# Patient Record
Sex: Female | Born: 1955 | Race: White | Hispanic: No | Smoking: Never smoker
Health system: Southern US, Community
[De-identification: ages and names within clinical notes are randomized; demographics above are authoritative.]

## PROBLEM LIST (undated history)

## (undated) DIAGNOSIS — I4891 Unspecified atrial fibrillation: Secondary | ICD-10-CM

## (undated) HISTORY — PX: CORONARY ANGIOPLASTY WITH STENT PLACEMENT: SHX49

---

## 2020-03-06 ENCOUNTER — Emergency Department (HOSPITAL_BASED_OUTPATIENT_CLINIC_OR_DEPARTMENT_OTHER): Payer: BC Managed Care – PPO

## 2020-03-06 ENCOUNTER — Encounter (HOSPITAL_BASED_OUTPATIENT_CLINIC_OR_DEPARTMENT_OTHER): Payer: Self-pay | Admitting: *Deleted

## 2020-03-06 ENCOUNTER — Other Ambulatory Visit: Payer: Self-pay

## 2020-03-06 DIAGNOSIS — N23 Unspecified renal colic: Secondary | ICD-10-CM | POA: Insufficient documentation

## 2020-03-06 DIAGNOSIS — I4891 Unspecified atrial fibrillation: Secondary | ICD-10-CM | POA: Diagnosis not present

## 2020-03-06 DIAGNOSIS — Z7901 Long term (current) use of anticoagulants: Secondary | ICD-10-CM | POA: Diagnosis not present

## 2020-03-06 DIAGNOSIS — R109 Unspecified abdominal pain: Secondary | ICD-10-CM | POA: Diagnosis present

## 2020-03-06 LAB — URINALYSIS, ROUTINE W REFLEX MICROSCOPIC
Bilirubin Urine: NEGATIVE
Glucose, UA: NEGATIVE mg/dL
Ketones, ur: NEGATIVE mg/dL
Leukocytes,Ua: NEGATIVE
Nitrite: NEGATIVE
Protein, ur: NEGATIVE mg/dL
Specific Gravity, Urine: 1.025 (ref 1.005–1.030)
pH: 6 (ref 5.0–8.0)

## 2020-03-06 LAB — URINALYSIS, MICROSCOPIC (REFLEX): WBC, UA: NONE SEEN WBC/hpf (ref 0–5)

## 2020-03-06 MED ORDER — ONDANSETRON 4 MG PO TBDP: 4.0000 mg | ORAL_TABLET | Freq: Once | ORAL | Status: AC

## 2020-03-06 MED ORDER — OXYCODONE-ACETAMINOPHEN 5-325 MG PO TABS
ORAL_TABLET | ORAL | Status: AC
  Administered 2020-03-06: 1 via ORAL
  Filled 2020-03-06: qty 1

## 2020-03-06 MED ORDER — ONDANSETRON 4 MG PO TBDP
ORAL_TABLET | ORAL | Status: AC
  Administered 2020-03-06: 4 mg via ORAL
  Filled 2020-03-06: qty 1

## 2020-03-06 MED ORDER — OXYCODONE-ACETAMINOPHEN 5-325 MG PO TABS: 1.0000 | ORAL_TABLET | Freq: Once | ORAL | Status: AC

## 2020-03-06 NOTE — ED Triage Notes (Signed)
C/o sudden onset of right flank pain with hematuria x 15 mins ago

## 2020-03-06 NOTE — ED Notes (Signed)
Pt states flank pain stopped after urination with u/a collection with  sm stone like object noted in urine cup

## 2020-03-07 ENCOUNTER — Emergency Department (HOSPITAL_BASED_OUTPATIENT_CLINIC_OR_DEPARTMENT_OTHER)
Admission: EM | Admit: 2020-03-07 | Discharge: 2020-03-07 | Disposition: A | Discharge status: Home / Self Care | Payer: BC Managed Care – PPO | Attending: Emergency Medicine | Admitting: Emergency Medicine

## 2020-03-07 DIAGNOSIS — N23 Unspecified renal colic: Secondary | ICD-10-CM

## 2020-03-07 HISTORY — DX: Unspecified atrial fibrillation: I48.91

## 2020-03-07 NOTE — ED Notes (Signed)
Verified pharmacy - patient does want her medications sent to [place] [facility]

## 2020-03-07 NOTE — ED Provider Notes (Signed)
[facility] Provider Note: Rhonda Dell, MD, FACEP  CSN: [id number] MRN: [id number] ARRIVAL: 03/06/20 at 2157 ROOM: [facility]03/[facility]03   CHIEF COMPLAINT  Flank Pain   HISTORY OF PRESENT ILLNESS  03/07/20 2:08 AM Rhonda Craig is a 64 y.o. female who noticed blood in your urine yesterday morning but this resolved later in the day.  About 9:45 PM yesterday she developed severe right flank pain which she rated as a 9 out of 10 on arrival.  It was not affected by movement. She had associated nausea.  She was given oxycodone and Zofran in triage.  The pain subsequently resolved and she is asymptomatic now.   Past Medical History:  Diagnosis Date  . Atrial fibrillation [facility])     Past Surgical History:  Procedure Laterality Date  . CORONARY ANGIOPLASTY WITH STENT PLACEMENT      History reviewed. No pertinent family history.  Social History   Tobacco Use  . Smoking status: Never Smoker  . Smokeless tobacco: Never Used  Substance Use Topics  . Alcohol use: Not on file  . Drug use: Not on file    Prior to Admission medications   Medication Sig Start Date End Date Taking? Authorizing Provider  amoxicillin-clavulanate (AUGMENTIN) 875-125 MG tablet SMARTSIG:1 Tablet(s) By Mouth Every 12 Hours 01/26/20   [provider]  calcium-vitamin D (OSCAL WITH D) 500-200 MG-UNIT TABS tablet Take by mouth.    [provider]  cetirizine (ZYRTEC) 10 MG tablet Take by mouth.    [provider]  ciprofloxacin-dexamethasone (CIPRODEX) OTIC suspension INSTILL 4 DROPS INTO AFFECTED EAR(S) TWICE A DAY FOR 7 DAYS 02/11/20   [provider]  clindamycin (CLEOCIN) 300 MG capsule Take 300 mg by mouth 3 (three) times daily. 02/07/20   [provider]  clopidogrel (PLAVIX) 300 MG TABS tablet Take by mouth.    [provider]  diltiazem (CARDIZEM) 60 MG tablet Take by mouth.    [provider]  dofetilide (TIKOSYN) 500 MCG capsule Take 500 mcg  by mouth 2 (two) times daily. 02/22/20   [provider]  ELIQUIS 5 MG TABS tablet Take 5 mg by mouth 2 (two) times daily. 02/22/20   [provider]  fluticasone (FLONASE) 50 MCG/ACT nasal spray Place 1 spray into both nostrils daily. 02/17/20   [provider]  HYDROcodone-acetaminophen (NORCO/VICODIN) 5-325 MG tablet Take 1 tablet by mouth every 6 (six) hours as needed. 01/26/20   [provider]  ipratropium (ATROVENT) 0.06 % nasal spray Place into both nostrils. 02/17/20   [provider]  levothyroxine (SYNTHROID) 88 MCG tablet Take 88 mcg by mouth daily. 02/17/20   [provider]  NASCOBAL 500 MCG/0.1ML SOLN Place into both nostrils once a week. 03/06/20   [provider]  vitamin B-12 (CYANOCOBALAMIN) 500 MCG tablet Take by mouth.    [provider]    Allergies Vibramycin [doxycycline]   REVIEW OF SYSTEMS  Negative except as noted here or in the History of Present Illness.   PHYSICAL EXAMINATION  Initial Vital Signs Blood pressure 138/79, pulse (!) 56, temperature 98.5 F (36.9 C), temperature source Oral, resp. rate 14, height 5\' 5"  (1.651 m), weight 78.5 kg, SpO2 100 %.  Examination General: Well-developed, well-nourished female in no acute distress; appearance consistent with age of record HENT: normocephalic; atraumatic Eyes: Normal appearance Neck: supple Heart: regular rate and rhythm Lungs: clear to auscultation bilaterally Abdomen: soft; nondistended; nontender; bowel sounds present GU: No CVA tenderness Extremities: No  deformity; full range of motion; pulses normal Neurologic: Awake, alert and oriented; motor function intact in all extremities and symmetric; no facial droop Skin: Warm and dry Psychiatric: Normal mood and affect   RESULTS  Summary of this visit's results, reviewed and interpreted by myself:   EKG Interpretation  Date/Time:    Ventricular Rate:    PR Interval:    QRS  Duration:   QT Interval:    QTC Calculation:   R Axis:     Text Interpretation:        Laboratory Studies: Results for orders placed or performed during the hospital encounter of 03/07/20 (from the past 24 hour(s))  Urinalysis, Routine w reflex microscopic Urine, Clean Catch     Status: Abnormal   Collection Time: 03/06/20 10:10 PM  Result Value Ref Range   Color, Urine YELLOW YELLOW   APPearance CLEAR CLEAR   Specific Gravity, Urine 1.025 1.005 - 1.030   pH 6.0 5.0 - 8.0   Glucose, UA NEGATIVE NEGATIVE mg/dL   Hgb urine dipstick LARGE (A) NEGATIVE   Bilirubin Urine NEGATIVE NEGATIVE   Ketones, ur NEGATIVE NEGATIVE mg/dL   Protein, ur NEGATIVE NEGATIVE mg/dL   Nitrite NEGATIVE NEGATIVE   Leukocytes,Ua NEGATIVE NEGATIVE  Urinalysis, Microscopic (reflex)     Status: Abnormal   Collection Time: 03/06/20 10:10 PM  Result Value Ref Range   RBC / HPF 21-50 0 - 5 RBC/hpf   WBC, UA NONE SEEN 0 - 5 WBC/hpf   Bacteria, UA RARE (A) NONE SEEN   Squamous Epithelial / LPF 0-5 0 - 5   Imaging Studies: CT Renal Stone Study  Result Date: 03/06/2020 CLINICAL DATA:  64 year old female with sudden onset right flank pain and gross hematuria. Atrial fibrillation EXAM: CT ABDOMEN AND PELVIS WITHOUT CONTRAST TECHNIQUE: Multidetector CT imaging of the abdomen and pelvis was performed following the standard protocol without IV contrast. COMPARISON:  None. FINDINGS: Lower chest: Cardiomegaly. No pericardial effusion. Negative visible lung bases; mild right lobe atelectasis or scarring. Hepatobiliary: Negative noncontrast liver. Contracted, negative gallbladder. Pancreas: Negative. Spleen: Negative. Adrenals/Urinary Tract: Normal adrenal glands. Negative noncontrast left kidney. Proximal left ureter appears decompressed. Right renal collecting system appears indistinct. No right nephrolithiasis. But the proximal right ureter appears relatively decompressed. The course of the ureter is difficult to delineate  distally, but no suspicious calcification is identified. There are superimposed vascular calcifications in the pelvis. Diminutive urinary bladder. No calculus identified within the bladder. Stomach/Bowel: Redundant large bowel with low-density retained stool in the right and transverse colon. No large bowel inflammation. Normal appendix on coronal image 38. Gas and flocculated material also in the terminal ileum. No dilated small bowel. Previous gastric bypass with gastrojejunostomy. There is some fluid in the bypassed portion of the stomach, although the duodenum is decompressed. Downstream small bowel anastomosis on series 2, image 38. No free air, free fluid, mesenteric stranding. Vascular/Lymphatic: Aortoiliac calcified atherosclerosis. Vascular patency is not evaluated in the absence of IV contrast. No lymphadenopathy is evident. Reproductive: Negative noncontrast appearance. Other: No pelvic free fluid. Musculoskeletal: Degenerative changes in the lumbar spine, including evidence of a gas containing rightward disc herniation at L4-L5 (series 2, image 43). No acute osseous abnormality identified. IMPRESSION: 1. Indistinct right renal collecting system, but no urinary calculus or explanation for obstructive uropathy. Consider a recently passed stone, versus right pyelonephritis or ascending urinary infection. 2. Prior Roux-en-Y type gastric bypass. No bowel obstruction or inflammation identified. Normal appendix. 3. Cardiomegaly.  Aortic Atherosclerosis (ICD10-I70.0). 4. 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 Electronically Signed   By: Odessa Fleming M.D.   On: 03/06/2020 22:34    ED COURSE and MDM  Nursing notes, initial and subsequent vitals signs, including pulse oximetry, reviewed and interpreted by myself.  Vitals:   03/06/20 2203 03/06/20 2214 03/07/20 0124  BP:  (!) 167/109 138/79  Pulse:  (!) 104 (!) 56  Resp:  18 14  Temp:  98.8 F (37.1 C) 98.5 F  (36.9 C)  TempSrc:   Oral  SpO2:  100% 100%  Weight: 78.5 kg    Height: 5\' 5"  (1.651 m)     Medications  oxyCODONE-acetaminophen (PERCOCET/ROXICET) 5-325 MG per tablet 1 tablet (1 tablet Oral Given 03/06/20 2210)  ondansetron (ZOFRAN-ODT) disintegrating tablet 4 mg (4 mg Oral Given 03/06/20 2211)   Presentation and CT findings consistent with passed kidney stone.  There is no evidence of infection.  Patient was advised that she should expect to be symptom-free due to passage of the stone.   PROCEDURES  Procedures   ED DIAGNOSES     ICD-10-CM   1. Ureteral colic  N23        Camie Hauss, MD 03/07/20 [phone]

## 2021-10-07 IMAGING — CT CT RENAL STONE PROTOCOL
2 of 4 series · 16 of 46 positions shown, 18 images · non-contrast
Comparison: None.

CLINICAL DATA: 64-year-old female with sudden onset right flank
pain and gross hematuria. Atrial fibrillation

EXAM:
CT ABDOMEN AND PELVIS WITHOUT CONTRAST
TECHNIQUE: Multidetector CT imaging of the abdomen and pelvis was performed
following the standard protocol without IV contrast.

[Series 2: axial st · axial · 0.79mm/px · z∈[+236,+636]mm · 13 of 88 slices shown, 15 images]
[im 4/88  soft-tissue]
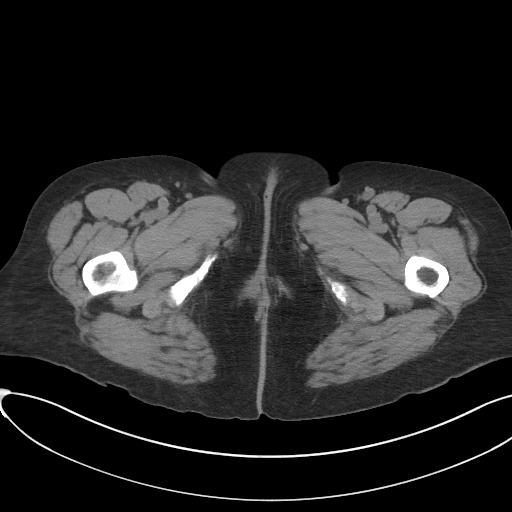
[im 4/88  bone]
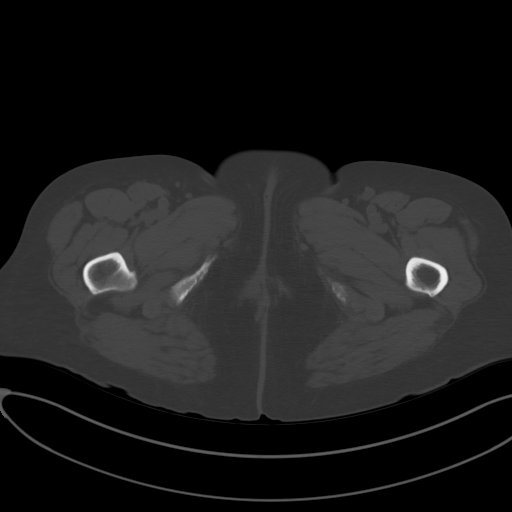
[im 11/88  soft-tissue]
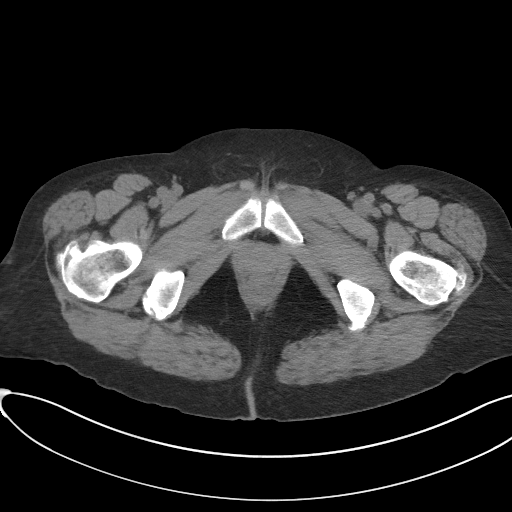
[im 19/88  soft-tissue]
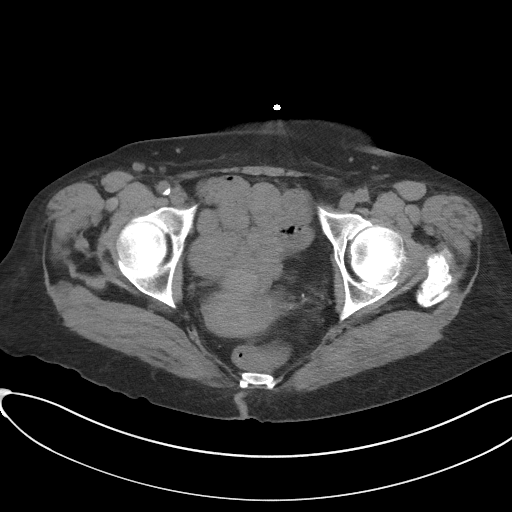
[im 26/88  soft-tissue]
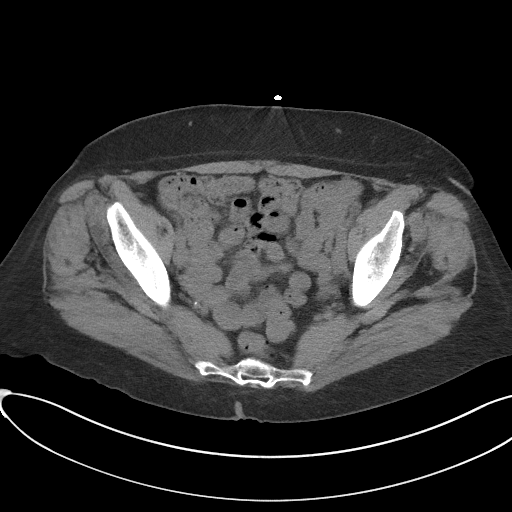
[im 30/88  soft-tissue]
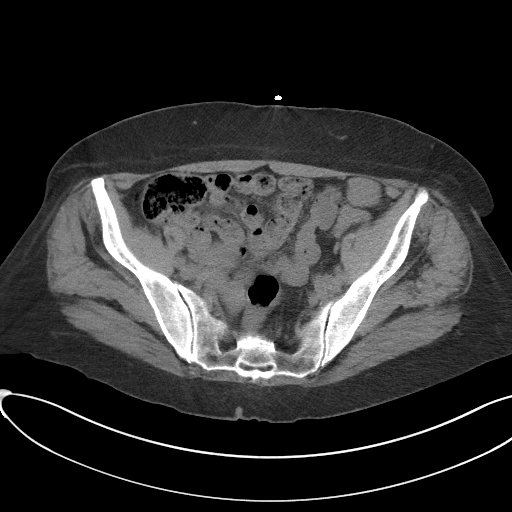
[im 37/88  soft-tissue]
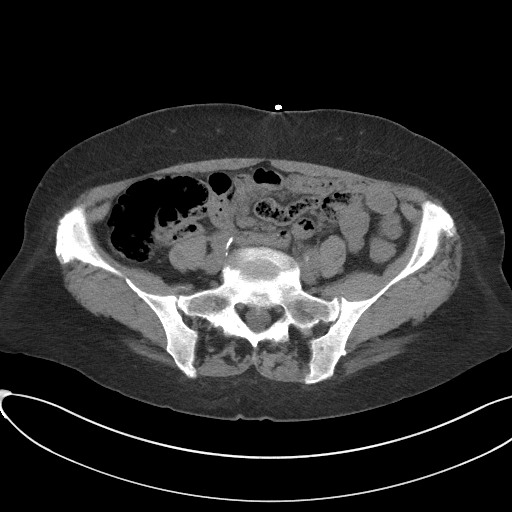
[im 44/88  soft-tissue]
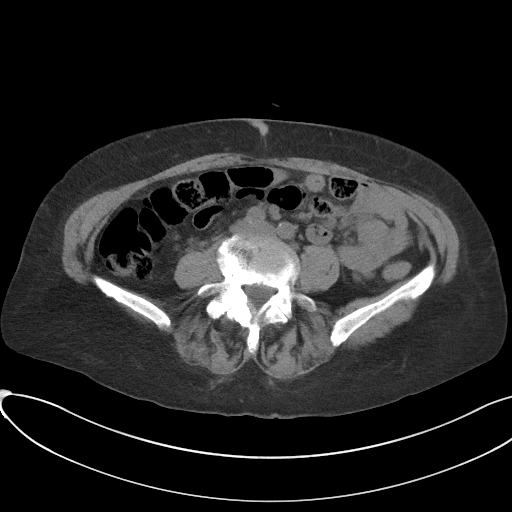
[im 51/88  soft-tissue]
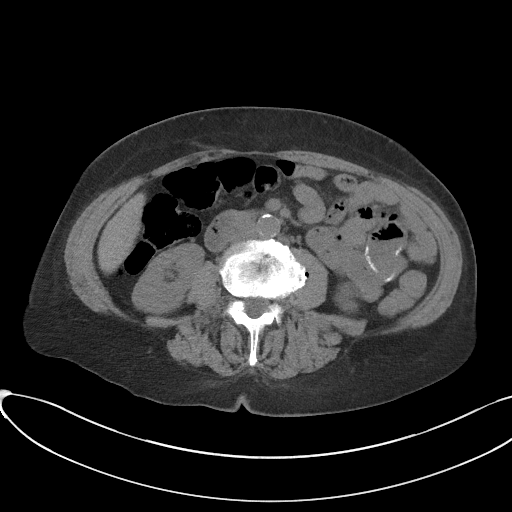
[im 59/88  soft-tissue]
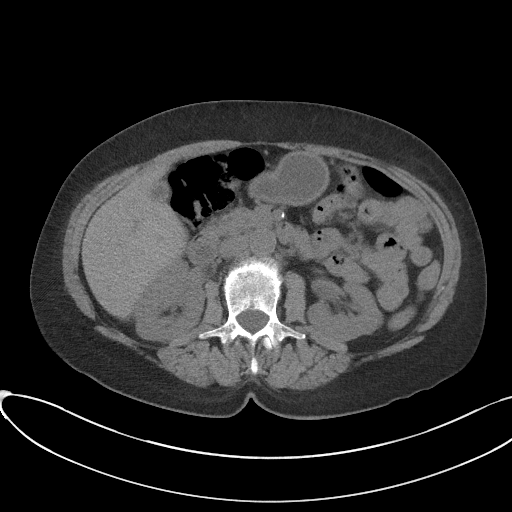
[im 59/88  bone]
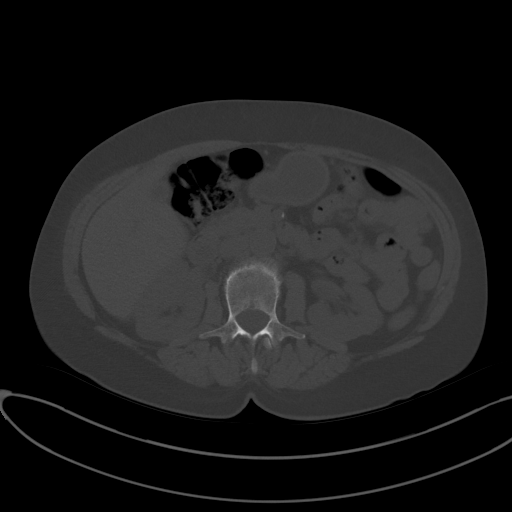
[im 62/88  soft-tissue]
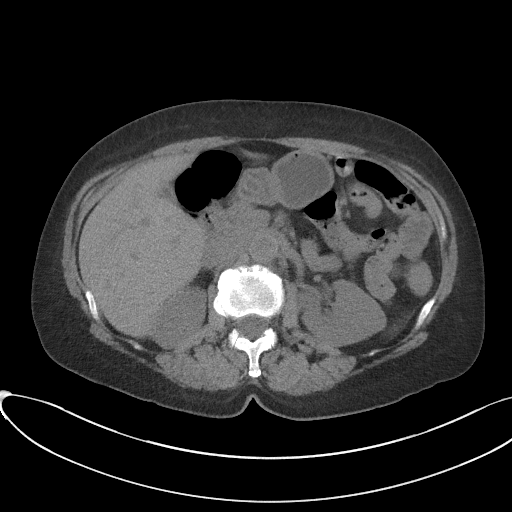
[im 69/88  soft-tissue]
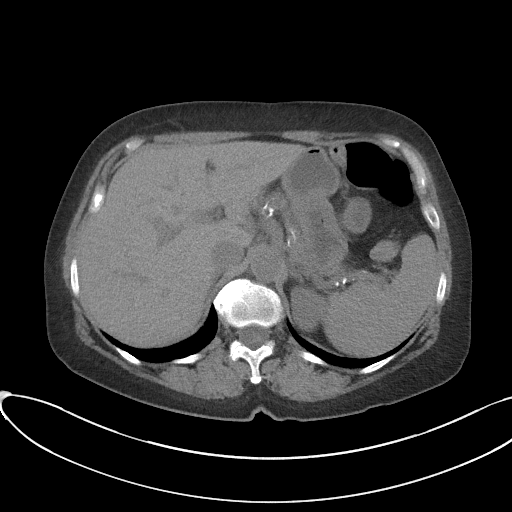
[im 77/88  soft-tissue]
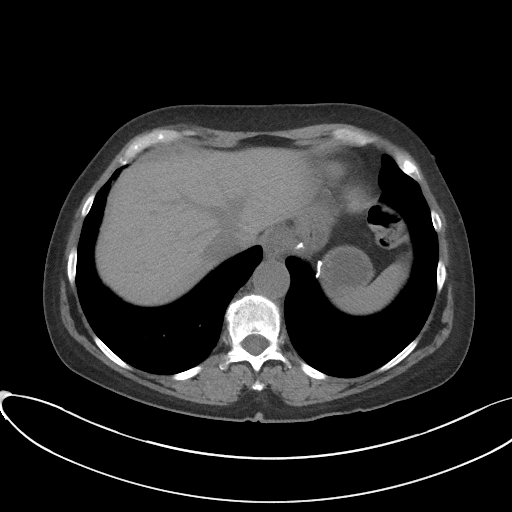
[im 84/88  soft-tissue]
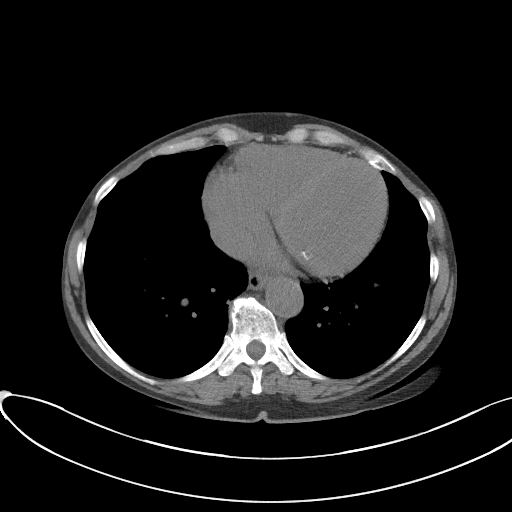

[Series 4: coronal st · coronal · 0.85mm/px · 3 of 101 slices shown]
[im 34/101  soft-tissue]
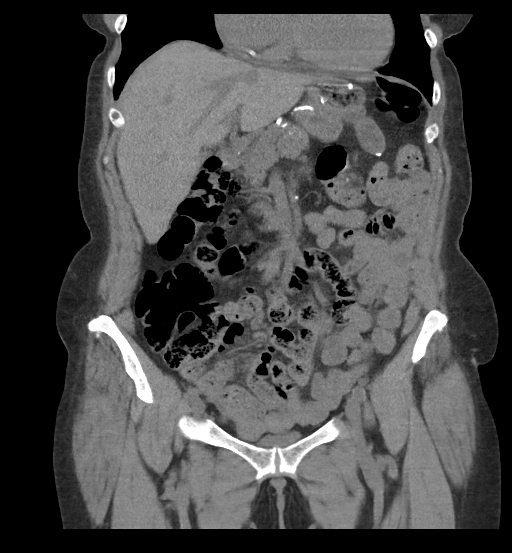
[im 45/101  soft-tissue]
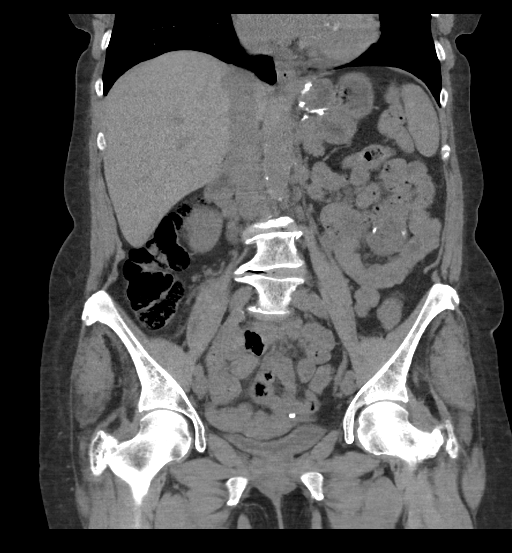
[im 56/101  soft-tissue]
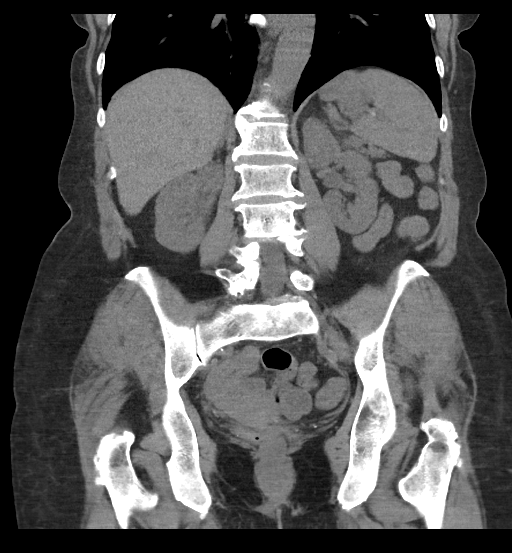

[16 of 46 positions shown; findings below may reference images not displayed]

FINDINGS: Lower chest: Cardiomegaly. No pericardial effusion. Negative visible
lung bases; mild right lobe atelectasis or scarring.

Hepatobiliary: Negative noncontrast liver. Contracted, negative
gallbladder.

Pancreas: Negative.

Spleen: Negative.

Adrenals/Urinary Tract: Normal adrenal glands. Negative noncontrast
left kidney. Proximal left ureter appears decompressed.

Right renal collecting system appears indistinct. No right
nephrolithiasis. But the proximal right ureter appears relatively
decompressed. The course of the ureter is difficult to delineate
distally, but no suspicious calcification is identified. There are
superimposed vascular calcifications in the pelvis.

Diminutive urinary bladder. No calculus identified within the
bladder.

Stomach/Bowel: Redundant large bowel with low-density retained stool
in the right and transverse colon. No large bowel inflammation.
Normal appendix on coronal image 38.

Gas and flocculated material also in the terminal ileum. No dilated
small bowel.

Previous gastric bypass with gastrojejunostomy. There is some fluid
in the bypassed portion of the stomach, although the duodenum is
decompressed. Downstream small bowel anastomosis on series 2, image
38. No free air, free fluid, mesenteric stranding.

Vascular/Lymphatic: Aortoiliac calcified atherosclerosis. Vascular
patency is not evaluated in the absence of IV contrast. No
lymphadenopathy is evident.

Reproductive: Negative noncontrast appearance.

Other: No pelvic free fluid.

Musculoskeletal: Degenerative changes in the lumbar spine, including
evidence of a gas containing rightward disc herniation at L4-L5
(series 2, image 43). No acute osseous abnormality identified.
IMPRESSION: 1. Indistinct right renal collecting system, but no urinary calculus
or explanation for obstructive uropathy.
Consider a recently passed stone, versus right pyelonephritis or
ascending urinary infection.

2. Prior Roux-en-Y type gastric bypass. No bowel obstruction or
inflammation identified. Normal appendix.

3. Cardiomegaly.  Aortic Atherosclerosis ([id number]).

4. Lumbar spine degeneration including evidence of a rightward disc
herniation at L4-L5.
